# Patient Record
Sex: Female | Born: 1970 | Race: White | Hispanic: No | Marital: Single | Smoking: Never smoker
Health system: Southern US, Community
[De-identification: ages and names within clinical notes are randomized; demographics above are authoritative.]

## PROBLEM LIST (undated history)

## (undated) DIAGNOSIS — M109 Gout, unspecified: Secondary | ICD-10-CM

## (undated) DIAGNOSIS — R7303 Prediabetes: Secondary | ICD-10-CM

---

## 2005-02-07 ENCOUNTER — Emergency Department: Payer: Self-pay | Admitting: Emergency Medicine

## 2005-02-18 ENCOUNTER — Emergency Department: Payer: Self-pay | Admitting: Emergency Medicine

## 2005-02-25 ENCOUNTER — Emergency Department: Payer: Self-pay | Admitting: Emergency Medicine

## 2005-06-13 ENCOUNTER — Emergency Department: Payer: Self-pay | Admitting: Emergency Medicine

## 2006-03-02 ENCOUNTER — Emergency Department: Payer: Self-pay | Admitting: Unknown Physician Specialty

## 2007-02-05 ENCOUNTER — Emergency Department: Payer: Self-pay | Admitting: Emergency Medicine

## 2007-02-09 ENCOUNTER — Emergency Department: Payer: Self-pay | Admitting: Unknown Physician Specialty

## 2009-02-07 ENCOUNTER — Emergency Department: Payer: Self-pay | Admitting: Unknown Physician Specialty

## 2009-03-21 ENCOUNTER — Emergency Department: Payer: Self-pay | Admitting: Emergency Medicine

## 2011-08-28 ENCOUNTER — Emergency Department: Payer: Self-pay | Admitting: Emergency Medicine

## 2012-12-09 ENCOUNTER — Emergency Department: Payer: Self-pay | Admitting: Emergency Medicine

## 2012-12-09 LAB — COMPREHENSIVE METABOLIC PANEL
Albumin: 3.8 g/dL (ref 3.4–5.0)
Alkaline Phosphatase: 55 U/L (ref 50–136)
Anion Gap: 12 (ref 7–16)
Bilirubin,Total: 0.5 mg/dL (ref 0.2–1.0)
Chloride: 108 mmol/L — ABNORMAL HIGH (ref 98–107)
Co2: 21 mmol/L (ref 21–32)
EGFR (African American): >60
Total Protein: 8.4 g/dL — ABNORMAL HIGH (ref 6.4–8.2)

## 2012-12-09 LAB — URINALYSIS, COMPLETE
Protein: 30
Specific Gravity: 1.028 (ref 1.003–1.030)

## 2012-12-09 LAB — CBC
HGB: 14 g/dL (ref 12.0–16.0)
MCH: 29.4 pg (ref 26.0–34.0)
MCHC: 33.6 g/dL (ref 32.0–36.0)
MCV: 88 fL (ref 80–100)
Platelet: 138 10*3/uL — ABNORMAL LOW (ref 150–440)
RBC: 4.76 10*6/uL (ref 3.80–5.20)
RDW: 12.7 % (ref 11.5–14.5)

## 2013-02-27 ENCOUNTER — Emergency Department: Payer: Self-pay | Admitting: Emergency Medicine

## 2013-04-16 ENCOUNTER — Emergency Department: Payer: Self-pay | Admitting: Emergency Medicine

## 2014-03-15 ENCOUNTER — Emergency Department: Payer: Self-pay | Admitting: Emergency Medicine

## 2015-11-15 ENCOUNTER — Emergency Department
Admission: EM | Admit: 2015-11-15 | Discharge: 2015-11-15 | Disposition: A | Discharge status: Home / Self Care | Payer: Self-pay | Attending: Emergency Medicine | Admitting: Emergency Medicine

## 2015-11-15 ENCOUNTER — Encounter: Payer: Self-pay | Admitting: Emergency Medicine

## 2015-11-15 DIAGNOSIS — R6511 Systemic inflammatory response syndrome (SIRS) of non-infectious origin with acute organ dysfunction: Secondary | ICD-10-CM | POA: Insufficient documentation

## 2015-11-15 DIAGNOSIS — L03116 Cellulitis of left lower limb: Secondary | ICD-10-CM | POA: Insufficient documentation

## 2015-11-15 DIAGNOSIS — R600 Localized edema: Secondary | ICD-10-CM | POA: Insufficient documentation

## 2015-11-15 DIAGNOSIS — L03115 Cellulitis of right lower limb: Secondary | ICD-10-CM | POA: Insufficient documentation

## 2015-11-15 DIAGNOSIS — L03119 Cellulitis of unspecified part of limb: Secondary | ICD-10-CM

## 2015-11-15 LAB — COMPREHENSIVE METABOLIC PANEL
ALT: 54 U/L (ref 14–54)
AST: 37 U/L (ref 15–41)
Albumin: 4.2 g/dL (ref 3.5–5.0)
Alkaline Phosphatase: 51 U/L (ref 38–126)
Anion gap: 9 (ref 5–15)
BUN: 12 mg/dL (ref 6–20)
CHLORIDE: 106 mmol/L (ref 101–111)
CO2: 22 mmol/L (ref 22–32)
Calcium: 9 mg/dL (ref 8.9–10.3)
Creatinine, Ser: 0.62 mg/dL (ref 0.44–1.00)
GFR calc Af Amer: >60 mL/min (ref >60)
Glucose, Bld: 94 mg/dL (ref 65–99)
POTASSIUM: 3.4 mmol/L — AB (ref 3.5–5.1)
SODIUM: 137 mmol/L (ref 135–145)
Total Bilirubin: 0.9 mg/dL (ref 0.3–1.2)
Total Protein: 7.8 g/dL (ref 6.5–8.1)

## 2015-11-15 LAB — URIC ACID: URIC ACID, SERUM: 4.2 mg/dL (ref 2.3–6.6)

## 2015-11-15 LAB — SEDIMENTATION RATE: SED RATE: 40 mm/h — AB (ref 0–20)

## 2015-11-15 LAB — CBC
HCT: 36.6 % (ref 35.0–47.0)
HEMOGLOBIN: 12.7 g/dL (ref 12.0–16.0)
MCH: 29 pg (ref 26.0–34.0)
MCHC: 34.7 g/dL (ref 32.0–36.0)
MCV: 83.6 fL (ref 80.0–100.0)
PLATELETS: 202 10*3/uL (ref 150–440)
RBC: 4.38 MIL/uL (ref 3.80–5.20)
RDW: 13.8 % (ref 11.5–14.5)
WBC: 4 10*3/uL (ref 3.6–11.0)

## 2015-11-15 LAB — TROPONIN I: Troponin I: <0.03 ng/mL (ref <0.031)

## 2015-11-15 MED ORDER — METHYLPREDNISOLONE SODIUM SUCC 125 MG IJ SOLR
125.0000 mg | Freq: Once | INTRAMUSCULAR | Status: AC
  Administered 2015-11-15: 125 mg via INTRAVENOUS
  Filled 2015-11-15: qty 2

## 2015-11-15 MED ORDER — MORPHINE SULFATE (PF) 4 MG/ML IV SOLN
4.0000 mg | Freq: Once | INTRAVENOUS | Status: AC
Start: 2015-11-15 — End: 2015-11-15
  Administered 2015-11-15: 4 mg via INTRAVENOUS
  Filled 2015-11-15: qty 1

## 2015-11-15 MED ORDER — PREDNISONE 20 MG PO TABS
40.0000 mg | ORAL_TABLET | Freq: Every day | ORAL | Status: DC
Start: 2015-11-15 — End: 2016-01-13

## 2015-11-15 MED ORDER — CEPHALEXIN 500 MG PO CAPS
500.0000 mg | ORAL_CAPSULE | Freq: Once | ORAL | Status: AC
  Administered 2015-11-15: 500 mg via ORAL
  Filled 2015-11-15: qty 1

## 2015-11-15 MED ORDER — MORPHINE SULFATE (PF) 4 MG/ML IV SOLN
4.0000 mg | Freq: Once | INTRAVENOUS | Status: AC
  Administered 2015-11-15: 4 mg via INTRAVENOUS
  Filled 2015-11-15: qty 1

## 2015-11-15 MED ORDER — OXYCODONE-ACETAMINOPHEN 5-325 MG PO TABS: 1.0000 | ORAL_TABLET | Freq: Four times a day (QID) | ORAL | Status: DC | PRN

## 2015-11-15 MED ORDER — ONDANSETRON HCL 4 MG/2ML IJ SOLN
4.0000 mg | Freq: Once | INTRAMUSCULAR | Status: AC
  Administered 2015-11-15: 4 mg via INTRAVENOUS
  Filled 2015-11-15: qty 2

## 2015-11-15 MED ORDER — CEPHALEXIN 500 MG PO CAPS: 500.0000 mg | ORAL_CAPSULE | Freq: Three times a day (TID) | ORAL | Status: DC

## 2015-11-15 NOTE — ED Notes (Signed)
NAD noted at time of D/C. Pt denies questions or concerns. Pt taken to the lobby via wheelchair at this time.  

## 2015-11-15 NOTE — ED Notes (Signed)
Pt in with co bilat feet swelling since yest, no hx of the same.

## 2015-11-15 NOTE — ED Notes (Signed)
Pt uprite on stretcher in exam room, tearful; pt reports pain & swelling/redness to feet bilat since yesterday; denies any accomp symptoms; denies hx of same; taking tylenol and goody's without relief; +PP, brisk cap refill, W&D, tenderness to touch

## 2015-11-15 NOTE — Discharge Instructions (Signed)
You have been seen in the emergency department today for bilateral foot swelling and pain. Your workup as shown largely normal results. As we discussed please take your medications as prescribed, please follow-up with your primary care physician in the next 1-2 days for recheck/reevaluation. If you do not currently have a primary care physician please call the number provided for [facility] clinic to arrange a follow-up appointment in 2 days for recheck. Return to the emergency department if you have any increased pain, develop a fever, or develop any other symptom personally concerning to yourself.   Peripheral Edema You have swelling in your legs (peripheral edema). This swelling is due to excess accumulation of salt and water in your body. Edema may be a sign of heart, kidney or liver disease, or a side effect of a medication. It may also be due to problems in the leg veins. Elevating your legs and using special support stockings may be very helpful, if the cause of the swelling is due to poor venous circulation. Avoid long periods of standing, whatever the cause. Treatment of edema depends on identifying the cause. Chips, pretzels, pickles and other salty foods should be avoided. Restricting salt in your diet is almost always needed. Water pills (diuretics) are often used to remove the excess salt and water from your body via urine. These medicines prevent the kidney from reabsorbing sodium. This increases urine flow. Diuretic treatment may also result in lowering of potassium levels in your body. Potassium supplements may be needed if you have to use diuretics daily. Daily weights can help you keep track of your progress in clearing your edema. You should call your caregiver for follow up care as recommended. SEEK IMMEDIATE MEDICAL CARE IF:   You have increased swelling, pain, redness, or heat in your legs.  You develop shortness of breath, especially when lying down.  You develop chest or  abdominal pain, weakness, or fainting.  You have a fever.   This information is not intended to replace advice given to you by your health care provider. Make sure you discuss any questions you have with your health care provider.   Document Released: 10/16/2004 Document Revised: 12/01/2011 Document Reviewed: 03/21/2015 Elsevier Interactive Patient Education 2016 [employer]  Cellulitis Cellulitis is an infection of the skin and the tissue under the skin. The infected area is usually red and tender. This happens most often in the arms and lower legs. HOME CARE   Take your antibiotic medicine as told. Finish the medicine even if you start to feel better.  Keep the infected arm or leg raised (elevated).  Put a warm cloth on the area up to 4 times per day.  Only take medicines as told by your doctor.  Keep all doctor visits as told. GET HELP IF:  You see red streaks on the skin coming from the infected area.  Your red area gets bigger or turns a dark color.  Your bone or joint under the infected area is painful after the skin heals.  Your infection comes back in the same area or different area.  You have a puffy (swollen) bump in the infected area.  You have new symptoms.  You have a fever. GET HELP RIGHT AWAY IF:   You feel very sleepy.  You throw up (vomit) or have watery poop (diarrhea).  You feel sick and have muscle aches and pains.   This information is not intended to replace advice given to you by your health care provider. Make  sure you discuss any questions you have with your health care provider.   Document Released: 02/25/2008 Document Revised: 05/30/2015 Document Reviewed: 11/24/2011 Elsevier Interactive Patient Education [employer]

## 2015-11-15 NOTE — ED Provider Notes (Signed)
[facility] Emergency Department Provider Note  Time seen: 6:06 AM  I have reviewed the triage vital signs and the nursing notes.   HISTORY  Chief Complaint Foot Swelling    HPI Leslie Greene is a 45 y.o. female with no past medical history presents the emergency department with bilateral foot swelling and pain since yesterday. According to the patient she first noticed around 6 PM yesterday that she was having pain in her feet with walking, states the pain has progressed and if he did become swollen especially on the left foot. Denies any nausea, vomiting, fever. Denies any history of gout. Denies diabetes.Describes her pain as moderate, worse with ambulation.     No past medical history on file.  There are no active problems to display for this patient.   No past surgical history on file.  No current outpatient prescriptions on file.  Allergies Review of patient's allergies indicates no known allergies.  No family history on file.  Social History Social History  Substance Use Topics  . Smoking status: Not on file  . Smokeless tobacco: Not on file  . Alcohol Use: Not on file    Review of Systems Constitutional: Negative for fever. Cardiovascular: Negative for chest pain. Respiratory: Negative for shortness of breath. Gastrointestinal: Negative for abdominal pain Musculoskeletal: Bilateral foot pain. Skin: Negative for rash. Neurological: Negative for headache 10-point ROS otherwise negative.  ____________________________________________   PHYSICAL EXAM:  VITAL SIGNS: ED Triage Vitals  Enc Vitals Group     BP 11/15/15 0553 129/83 mmHg     Pulse Rate 11/15/15 0553 75     Resp 11/15/15 0553 18     Temp 11/15/15 0553 98.3 F (36.8 C)     Temp Source 11/15/15 0553 Oral     SpO2 11/15/15 0553 100 %     Weight 11/15/15 0553 190 lb (86.183 kg)     Height 11/15/15 0553 5' (1.524 m)     Head Cir --      Peak Flow --      Pain Score  11/15/15 0553 10     Pain Loc --      Pain Edu? --      Excl. in GC? --     Constitutional: Alert and oriented. Well appearing and in no distress. Eyes: Normal exam ENT   Head: Normocephalic and atraumatic.   Mouth/Throat: Mucous membranes are moist. Cardiovascular: Normal rate, regular rhythm. No murmur Respiratory: Normal respiratory effort without tachypnea nor retractions. Breath sounds are clear  Gastrointestinal: Soft and nontender. No distention.  Musculoskeletal: Patient has moderate edema of the left foot, several areas of erythema on the left foot with warmth. Right foot is not appreciably swollen, there is redness and moderate tenderness to palpation over the right first MTP joint. No calf tenderness or popliteal tenderness to palpation. Neurologic:  Normal speech and language. No gross focal neurologic deficits Skin:  Skin is warm, dry  ____________________________________________    INITIAL IMPRESSION / ASSESSMENT AND PLAN / ED COURSE  Pertinent labs & imaging results that were available during my care of the patient were reviewed by me and considered in my medical decision making (see chart for details).  Patient with bilateral foot swelling and tenderness. Patient does have moderate tenderness over the bilateral MTP joints, denies any history of gout although her exam is suggestive of gout. Patient sensation is intact bilaterally, 2+ DP pulse bilaterally. No calf tenderness to palpation.  Patient's labs are largely within normal  limits besides an elevated ESR of 40. It is unclear the exact etiology of her foot pain and swelling, likely an inflammatory response of some kind. No clinical concern for DVT. Given her elevated ESR we will start the patient on a course of prednisone, pain medication and will also cover with Keflex for a possible cellulitis. Patient is to follow-up with her primary care physician in 2 days for recheck/reevaluation. I discussed this plan of  care with the patient who is agreeable.  ____________________________________________   FINAL CLINICAL IMPRESSION(S) / ED DIAGNOSES  Inflammatory response Cellulitis   Harvest Dark, MD 11/15/15 [id number]

## 2016-01-13 ENCOUNTER — Encounter: Payer: Self-pay | Admitting: Emergency Medicine

## 2016-01-13 ENCOUNTER — Emergency Department
Admission: EM | Admit: 2016-01-13 | Discharge: 2016-01-13 | Disposition: A | Discharge status: Home / Self Care | Payer: Self-pay | Attending: Emergency Medicine | Admitting: Emergency Medicine

## 2016-01-13 DIAGNOSIS — M1000 Idiopathic gout, unspecified site: Secondary | ICD-10-CM | POA: Insufficient documentation

## 2016-01-13 DIAGNOSIS — M79671 Pain in right foot: Secondary | ICD-10-CM | POA: Insufficient documentation

## 2016-01-13 LAB — CBC
HCT: 41 % (ref 35.0–47.0)
Hemoglobin: 14.2 g/dL (ref 12.0–16.0)
MCH: 29.3 pg (ref 26.0–34.0)
MCHC: 34.6 g/dL (ref 32.0–36.0)
MCV: 84.7 fL (ref 80.0–100.0)
Platelets: 238 10*3/uL (ref 150–440)
RBC: 4.84 MIL/uL (ref 3.80–5.20)
RDW: 13 % (ref 11.5–14.5)
WBC: 6.6 10*3/uL (ref 3.6–11.0)

## 2016-01-13 MED ORDER — PREDNISONE 10 MG PO TABS: ORAL_TABLET | ORAL | Status: DC

## 2016-01-13 MED ORDER — OXYCODONE-ACETAMINOPHEN 5-325 MG PO TABS
1.0000 | ORAL_TABLET | ORAL | Status: AC | PRN
  Administered 2016-01-13 (×2): 1 via ORAL
  Filled 2016-01-13: qty 1

## 2016-01-13 MED ORDER — OXYCODONE-ACETAMINOPHEN 5-325 MG PO TABS
ORAL_TABLET | ORAL | Status: AC
  Administered 2016-01-13: 1 via ORAL
  Filled 2016-01-13: qty 1

## 2016-01-13 MED ORDER — COLCHICINE 0.6 MG PO TABS: 0.6000 mg | ORAL_TABLET | Freq: Every day | ORAL | Status: AC

## 2016-01-13 MED ORDER — COLCHICINE 0.6 MG PO TABS
1.2000 mg | ORAL_TABLET | Freq: Once | ORAL | Status: AC
  Administered 2016-01-13: 1.2 mg via ORAL
  Filled 2016-01-13: qty 2

## 2016-01-13 MED ORDER — PREDNISONE 20 MG PO TABS
40.0000 mg | ORAL_TABLET | Freq: Once | ORAL | Status: AC
  Administered 2016-01-13: 40 mg via ORAL
  Filled 2016-01-13: qty 2

## 2016-01-13 NOTE — Discharge Instructions (Signed)
You were evaluated for foot pain and swelling, and her symptoms are consistent with gout.  At this point I do not think the redness is an active infection called cellulitis.  You are being prescribed medication called colchicine, one dose daily until 3 days with no symptoms.  You're being prescribed prednisone, which is an anti-inflammation and to help with gout.  Return to the emergency room for any worsening pain, redness and rash, fever, any calf pain or redness up into the calf. Return for any nausea or vomiting.  You need to follow up with a primary care physician, and are [facility].   Gout Gout is an inflammatory arthritis caused by a buildup of uric acid crystals in the joints. Uric acid is a chemical that is normally present in the blood. When the level of uric acid in the blood is too high it can form crystals that deposit in your joints and tissues. This causes joint redness, soreness, and swelling (inflammation). Repeat attacks are common. Over time, uric acid crystals can form into masses (tophi) near a joint, destroying bone and causing disfigurement. Gout is treatable and often preventable. CAUSES  The disease begins with elevated levels of uric acid in the blood. Uric acid is produced by your body when it breaks down a naturally found substance called purines. Certain foods you eat, such as meats and fish, contain high amounts of purines. Causes of an elevated uric acid level include:  Being passed down from parent to child (heredity).  Diseases that cause increased uric acid production (such as obesity, psoriasis, and certain cancers).  Excessive alcohol use.  Diet, especially diets rich in meat and seafood.  Medicines, including certain cancer-fighting medicines (chemotherapy), water pills (diuretics), and aspirin.  Chronic kidney disease. The kidneys are no longer able to remove uric acid well.  Problems with metabolism. Conditions strongly  associated with gout include:  Obesity.  High blood pressure.  High cholesterol.  Diabetes. Not everyone with elevated uric acid levels gets gout. It is not understood why some people get gout and others do not. Surgery, joint injury, and eating too much of certain foods are some of the factors that can lead to gout attacks. SYMPTOMS   An attack of gout comes on quickly. It causes intense pain with redness, swelling, and warmth in a joint.  Fever can occur.  Often, only one joint is involved. Certain joints are more commonly involved:  Base of the big toe.  Knee.  Ankle.  Wrist.  Finger. Without treatment, an attack usually goes away in a few days to weeks. Between attacks, you usually will not have symptoms, which is different from many other forms of arthritis. DIAGNOSIS  Your caregiver will suspect gout based on your symptoms and exam. In some cases, tests may be recommended. The tests may include:  Blood tests.  Urine tests.  X-rays.  Joint fluid exam. This exam requires a needle to remove fluid from the joint (arthrocentesis). Using a microscope, gout is confirmed when uric acid crystals are seen in the joint fluid. TREATMENT  There are two phases to gout treatment: treating the sudden onset (acute) attack and preventing attacks (prophylaxis).  Treatment of an Acute Attack.  Medicines are used. These include anti-inflammatory medicines or steroid medicines.  An injection of steroid medicine into the affected joint is sometimes necessary.  The painful joint is rested. Movement can worsen the arthritis.  You may use warm or cold treatments on painful joints,  depending which works best for you.  Treatment to Prevent Attacks.  If you suffer from frequent gout attacks, your caregiver may advise preventive medicine. These medicines are started after the acute attack subsides. These medicines either help your kidneys eliminate uric acid from your body or decrease  your uric acid production. You may need to stay on these medicines for a very long time.  The early phase of treatment with preventive medicine can be associated with an increase in acute gout attacks. For this reason, during the first few months of treatment, your caregiver may also advise you to take medicines usually used for acute gout treatment. Be sure you understand your caregiver's directions. Your caregiver may make several adjustments to your medicine dose before these medicines are effective.  Discuss dietary treatment with your caregiver or dietitian. Alcohol and drinks high in sugar and fructose and foods such as meat, poultry, and seafood can increase uric acid levels. Your caregiver or dietitian can advise you on drinks and foods that should be limited. HOME CARE INSTRUCTIONS   Do not take aspirin to relieve pain. This raises uric acid levels.  Only take over-the-counter or prescription medicines for pain, discomfort, or fever as directed by your caregiver.  Rest the joint as much as possible. When in bed, keep sheets and blankets off painful areas.  Keep the affected joint raised (elevated).  Apply warm or cold treatments to painful joints. Use of warm or cold treatments depends on which works best for you.  Use crutches if the painful joint is in your leg.  Drink enough fluids to keep your urine clear or pale yellow. This helps your body get rid of uric acid. Limit alcohol, sugary drinks, and fructose drinks.  Follow your dietary instructions. Pay careful attention to the amount of protein you eat. Your daily diet should emphasize fruits, vegetables, whole grains, and fat-free or low-fat milk products. Discuss the use of coffee, vitamin C, and cherries with your caregiver or dietitian. These may be helpful in lowering uric acid levels.  Maintain a healthy body weight. SEEK MEDICAL CARE IF:   You develop diarrhea, vomiting, or any side effects from medicines.  You do not  feel better in 24 hours, or you are getting worse. SEEK IMMEDIATE MEDICAL CARE IF:   Your joint becomes suddenly more tender, and you have chills or a fever. MAKE SURE YOU:   Understand these instructions.  Will watch your condition.  Will get help right away if you are not doing well or get worse.   This information is not intended to replace advice given to you by your health care provider. Make sure you discuss any questions you have with your health care provider.   Document Released: 09/05/2000 Document Revised: 09/29/2014 Document Reviewed: 04/21/2012 Elsevier Interactive Patient Education [employer]

## 2016-01-13 NOTE — ED Provider Notes (Signed)
[facility] Emergency Department Provider Note   ____________________________________________  Time seen: Approximately 12 PM I have reviewed the triage vital signs and the triage nursing note.  HISTORY  Chief Complaint Foot Pain and Cellulitis   Historian Patient  HPI Leslie Greene is a 45 y.o. female who is here with bilateral foot pain. Pain started yesterday. She states that she's had one episode like this several months ago. At that point she was given prednisone and antibiotic for possible cellulitis. When I reviewed her chart it looks like she was diagnosed with likely gout with possible cellulitis.  Patient did not follow-up with primary care physician as she does not have one.  In terms of this episode, no systemic illness, no fevers, no nausea, no swelling past the mid foot.  Nothing makes it worse or better. Pain is severe.    History reviewed. No pertinent past medical history.  There are no active problems to display for this patient.   History reviewed. No pertinent past surgical history.  Current Outpatient Rx  Name  Route  Sig  Dispense  Refill  . cephALEXin (KEFLEX) 500 MG capsule   Oral   Take 1 capsule (500 mg total) by mouth 3 (three) times daily.   30 capsule   0   . oxyCODONE-acetaminophen (ROXICET) 5-325 MG tablet   Oral   Take 1 tablet by mouth every 6 (six) hours as needed.   15 tablet   0   . predniSONE (DELTASONE) 20 MG tablet   Oral   Take 2 tablets (40 mg total) by mouth daily.   10 tablet   0     Allergies Review of patient's allergies indicates no known allergies.  History reviewed. No pertinent family history.  Social History Social History  Substance Use Topics  . Smoking status: Never Smoker   . Smokeless tobacco: None  . Alcohol Use: No    Review of Systems  Constitutional: Negative for fever. Eyes: Negative for visual changes. ENT: Negative for sore throat. Cardiovascular: Negative for  chest pain. Respiratory: Negative for shortness of breath. Gastrointestinal: Negative for abdominal pain, vomiting and diarrhea. Genitourinary: Negative for dysuria. Musculoskeletal: Negative for back pain. Skin: Some swelling and redness over the MTP bilaterally. Neurological: Negative for headache. 10 point Review of Systems otherwise negative ____________________________________________   PHYSICAL EXAM:  VITAL SIGNS: ED Triage Vitals  Enc Vitals Group     BP 01/13/16 0856 132/104 mmHg     Pulse Rate 01/13/16 0853 91     Resp 01/13/16 0853 18     Temp 01/13/16 0853 98.1 F (36.7 C)     Temp Source 01/13/16 0853 Oral     SpO2 01/13/16 0853 95 %     Weight 01/13/16 0853 190 lb (86.183 kg)     Height 01/13/16 0853 5' (1.524 m)     Head Cir --      Peak Flow --      Pain Score 01/13/16 0856 10     Pain Loc --      Pain Edu? --      Excl. in GC? --      Constitutional: Alert and oriented. Well appearing Overall but complaining of pain in her feet.Marland Kitchen HEENT   Head: Normocephalic and atraumatic.      Eyes: Conjunctivae are normal. PERRL. Normal extraocular movements.      Ears:         Nose: No congestion/rhinnorhea.   Mouth/Throat: Mucous membranes are moist.  Neck: No stridor. Cardiovascular/Chest: Normal rate, regular rhythm.  No murmurs, rubs, or gallops. Respiratory: Normal respiratory effort without tachypnea nor retractions. Breath sounds are clear and equal bilaterally. No wheezes/rales/rhonchi. Gastrointestinal: Soft. No distention, no guarding, no rebound. Nontender.    Genitourinary/rectal:Deferred Musculoskeletal: Mild edema of bilateral feet. Erythema and tenderness over his bilateral MTP joints, especially the right. She does have some redness along the sole.  No calf tenderness. She has some scabbing of the left posterior calf. Neurologic:  Normal speech and language. No gross or focal neurologic deficits are appreciated. Skin:  Skin is warm, dry and  intact. No rash noted. Psychiatric: Mood and affect are normal. Speech and behavior are normal. Patient exhibits appropriate insight and judgment.  ____________________________________________   EKG I, Governor Rooksebecca Navin Dogan, MD, the attending physician have personally viewed and interpreted all ECGs.  None ____________________________________________  LABS (pertinent positives/negatives)  White blood count 6.6, hemoglobin 14.2 and platelet count 238  ____________________________________________  RADIOLOGY All Xrays were viewed by me. Imaging interpreted by Radiologist.  None __________________________________________  PROCEDURES  Procedure(s) performed: None  Critical Care performed: None  ____________________________________________   ED COURSE / ASSESSMENT AND PLAN  Pertinent labs & imaging results that were available during my care of the patient were reviewed by me and considered in my medical decision making (see chart for details).   Clinically her exam is consistent with gout. When I reviewed her prior note from February, Dr. Lenard LancePaduchowski also clinically felt the patient was likely experiencing gout, and treated her with prednisone and also course of Keflex for the possibility of additional cellulitis. Patient herself states that the swelling and redness is a lot less this time that it was last time.  She's not giving any infectious symptoms including no fevers, or systemic illness. Her white blood cell count is normal.  The redness and pain is focused over the first MTP bilaterally but extends along the line.  No evidence of DVT clinically.  She has been trying Tylenol and Aleve at home.  I will try a dose of colchicine here as well as a course of prednisone by mouth. I referred her for clinical clinic primary care follow-up.  We discussed return precautions especially with respect to infectious/cellulitis symptoms.   CONSULTATIONS:   None   Patient / Family /  Caregiver informed of clinical course, medical decision-making process, and agree with plan.   ___________________________________________   FINAL CLINICAL IMPRESSION(S) / ED DIAGNOSES   Final diagnoses:  Idiopathic gout, unspecified chronicity, unspecified site              Note: This dictation was prepared with Dragon dictation. Any transcriptional errors that result from this process are unintentional   Governor Rooksebecca Dorcas Melito, MD 01/13/16 1230

## 2016-01-13 NOTE — ED Notes (Signed)
Patient with mild redness and swelling to both feet. Redness worse on inner aspects of feet around great toe joints.

## 2016-01-13 NOTE — ED Notes (Signed)
Pt given water 

## 2016-01-13 NOTE — ED Notes (Signed)
MD at bedside. 

## 2016-01-13 NOTE — ED Notes (Signed)
Patient noticed yesterday evening redness to both feet and pain. Unable to stand without pain.  Patient states she has had cellulitis to her feet in the past and feels like the same pain, but worse.  Feet are swollen.

## 2016-01-13 NOTE — ED Notes (Signed)
Pt family out to desk asking for pt to have something for pain. Family informed by 2 different RN that MD will have to seen patient and prescribe something for pain. MD informed, no orders at this time. PT has received 2 percocet per protocol prior to this time.

## 2017-02-22 ENCOUNTER — Emergency Department: Payer: Self-pay

## 2017-02-22 ENCOUNTER — Encounter: Payer: Self-pay | Admitting: Emergency Medicine

## 2017-02-22 ENCOUNTER — Emergency Department
Admission: EM | Admit: 2017-02-22 | Discharge: 2017-02-22 | Disposition: A | Discharge status: Home / Self Care | Payer: Self-pay | Attending: Emergency Medicine | Admitting: Emergency Medicine

## 2017-02-22 DIAGNOSIS — M25571 Pain in right ankle and joints of right foot: Secondary | ICD-10-CM | POA: Insufficient documentation

## 2017-02-22 DIAGNOSIS — M25561 Pain in right knee: Secondary | ICD-10-CM | POA: Insufficient documentation

## 2017-02-22 HISTORY — DX: Gout, unspecified: M10.9

## 2017-02-22 HISTORY — DX: Prediabetes: R73.03

## 2017-02-22 MED ORDER — PREDNISONE 10 MG (21) PO TBPK: ORAL_TABLET | ORAL | 0 refills | Status: DC

## 2017-02-22 MED ORDER — LORAZEPAM 0.5 MG PO TABS
0.5000 mg | ORAL_TABLET | Freq: Once | ORAL | Status: AC
  Administered 2017-02-22: 0.5 mg via ORAL
  Filled 2017-02-22: qty 1

## 2017-02-22 MED ORDER — PREDNISONE 20 MG PO TABS
60.0000 mg | ORAL_TABLET | Freq: Once | ORAL | Status: AC
  Administered 2017-02-22: 60 mg via ORAL
  Filled 2017-02-22: qty 3

## 2017-02-22 NOTE — ED Triage Notes (Signed)
Patient Arrived [facility] with complaint of right knee and right ankle pain. Patient states she woke with this pain, no recent falls or trauma. Hx of gout, denies similar pain to previous gout. Described as numb pain.
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
## 2017-06-09 ENCOUNTER — Emergency Department
Admission: EM | Admit: 2017-06-09 | Discharge: 2017-06-09 | Disposition: A | Discharge status: Home / Self Care | Payer: Self-pay | Attending: Emergency Medicine | Admitting: Emergency Medicine

## 2017-06-09 ENCOUNTER — Encounter: Payer: Self-pay | Admitting: Emergency Medicine

## 2017-06-09 DIAGNOSIS — J069 Acute upper respiratory infection, unspecified: Secondary | ICD-10-CM | POA: Insufficient documentation

## 2017-06-09 MED ORDER — FLUTICASONE PROPIONATE 50 MCG/ACT NA SUSP: 2.0000 | Freq: Every day | NASAL | 0 refills | Status: AC

## 2017-06-09 MED ORDER — GUAIFENESIN-CODEINE 100-10 MG/5ML PO SYRP: 5.0000 mL | ORAL_SOLUTION | Freq: Three times a day (TID) | ORAL | 0 refills | Status: AC | PRN

## 2017-06-09 NOTE — ED Notes (Signed)
See triage note  States she worked a long shift this past weekend and developed some sinus pressure and cough   Did fever some fever on Sunday and Monday  But afebrile at present

## 2017-06-09 NOTE — ED Triage Notes (Signed)
Cold sx. Fever yesterday and day before.  Says she needs to know if she is contagious .

## 2017-06-09 NOTE — ED Provider Notes (Signed)
[facility] Emergency Department Provider Note  ____________________________________________  Time seen: Approximately 12:32 PM  I have reviewed the triage vital signs and the nursing notes.   HISTORY  Chief Complaint Cough and URI    HPI Bretta Fees is a 46 y.o. female that presents to the emergency department with 2 days of nasal congestion and non productive cough.Patient works at [employer] and needs to now if she is contagious and if she needs to stay home from work. She took NyQuil with some relief. She does not smoke. She denies fever, sore throat, shortness of breath, chest pain, nausea, vomiting, abdominal pain.   Past Medical History:  Diagnosis Date  . Borderline diabetic   . Gout     There are no active problems to display for this patient.   History reviewed. No pertinent surgical history.  Prior to Admission medications   Medication Sig Start Date End Date Taking? Authorizing Provider  cephALEXin (KEFLEX) 500 MG capsule Take 1 capsule (500 mg total) by mouth 3 (three) times daily. 11/15/15   Minna Antis, MD  colchicine 0.6 MG tablet Take 1 tablet (0.6 mg total) by mouth daily. 01/13/16 01/12/17  Governor Rooks, MD  fluticasone (FLONASE) 50 MCG/ACT nasal spray Place 2 sprays into both nostrils daily. 06/09/17 06/09/18  Enid Derry, PA-C  guaiFENesin-codeine (ROBITUSSIN AC) 100-10 MG/5ML syrup Take 5 mLs by mouth 3 (three) times daily as needed for cough. 06/09/17 06/11/17  Enid Derry, PA-C  oxyCODONE-acetaminophen (ROXICET) 5-325 MG tablet Take 1 tablet by mouth every 6 (six) hours as needed. 11/15/15   Minna Antis, MD  predniSONE (STERAPRED UNI-PAK 21 TAB) 10 MG (21) TBPK tablet Take 6 tablets on day 1. Take 5 tablets on day 2. Take 4 tablets on day 3. Take 3 tablets on day 4. Take 2 tablets on day 5. Take 1 tablets on day 6. 02/22/17   Little, Traci M, PA-C    Allergies Patient has no known allergies.  No family history on  file.  Social History Social History  Substance Use Topics  . Smoking status: Never Smoker  . Smokeless tobacco: Never Used  . Alcohol use No     Review of Systems  Constitutional: No fever/chills Eyes: No visual changes. No discharge. ENT: Positive for congestion and rhinorrhea. Cardiovascular: No chest pain. Respiratory: Positive for cough. No SOB. Gastrointestinal: No abdominal pain.  No nausea, no vomiting.  No diarrhea.  No constipation. Musculoskeletal: Negative for musculoskeletal pain. Skin: Negative for rash, abrasions, lacerations, ecchymosis. Neurological: Negative for headaches.   ____________________________________________   PHYSICAL EXAM:  VITAL SIGNS: ED Triage Vitals [06/09/17 1208]  Enc Vitals Group     BP 119/70     Pulse Rate 79     Resp 16     Temp 98.2 F (36.8 C)     Temp Source Oral     SpO2 97 %     Weight 210 lb (95.3 kg)     Height 5' (1.524 m)     Head Circumference      Peak Flow      Pain Score 0     Pain Loc      Pain Edu?      Excl. in GC?      Constitutional: Alert and oriented. Well appearing and in no acute distress. Eyes: Conjunctivae are normal. PERRL. EOMI. No discharge. Head: Atraumatic. ENT: No frontal and maxillary sinus tenderness.      Ears: Tympanic membranes pearly gray with  good landmarks. No discharge.      Nose: Mild congestion/rhinnorhea.      Mouth/Throat: Mucous membranes are moist. Oropharynx non-erythematous. Tonsils not enlarged. No exudates. Uvula midline. Neck: No stridor.   Hematological/Lymphatic/Immunilogical: No cervical lymphadenopathy. Cardiovascular: Normal rate, regular rhythm.  Good peripheral circulation. Respiratory: Normal respiratory effort without tachypnea or retractions. Lungs CTAB. Good air entry to the bases with no decreased or absent breath sounds. Gastrointestinal: Bowel sounds 4 quadrants. Soft and nontender to palpation. No guarding or rigidity. No palpable masses. No  distention. Musculoskeletal: Full range of motion to all extremities. No gross deformities appreciated. Neurologic:  Normal speech and language. No gross focal neurologic deficits are appreciated.  Skin:  Skin is warm, dry and intact. No rash noted.   ____________________________________________   LABS (all labs ordered are listed, but only abnormal results are displayed)  Labs Reviewed - No data to display ____________________________________________  EKG   ____________________________________________  RADIOLOGY   No results found.  ____________________________________________    PROCEDURES  Procedure(s) performed:    Procedures    Medications - No data to display   ____________________________________________   INITIAL IMPRESSION / ASSESSMENT AND PLAN / ED COURSE  Pertinent labs & imaging results that were available during my care of the patient were reviewed by me and considered in my medical decision making (see chart for details).  Review of the [place] CSRS was performed in accordance of the [facility] prior to dispensing any controlled drugs.   Patient's diagnosis is consistent with upper respiratory infection. Vital signs and exam are reassuring. Patient feels comfortable going home. Work note was provided. Patient will be discharged home with prescriptions for Robitussin and Flonase. Patient is to follow up with PCP as needed or otherwise directed. Patient is given ED precautions to return to the ED for any worsening or new symptoms.     ____________________________________________  FINAL CLINICAL IMPRESSION(S) / ED DIAGNOSES  Final diagnoses:  Viral upper respiratory tract infection      NEW MEDICATIONS STARTED DURING THIS VISIT:  Discharge Medication List as of 06/09/2017  1:05 PM    START taking these medications   Details  fluticasone (FLONASE) 50 MCG/ACT nasal spray Place 2 sprays into both nostrils daily., Starting Tue 06/09/2017, Until Wed  06/09/2018, Print    guaiFENesin-codeine (ROBITUSSIN AC) 100-10 MG/5ML syrup Take 5 mLs by mouth 3 (three) times daily as needed for cough., Starting Tue 06/09/2017, Until Thu 06/11/2017, Print            This chart was dictated using voice recognition software/Dragon. Despite best efforts to proofread, errors can occur which can change the meaning. Any change was purely unintentional.    Enid Derry, PA-C 06/09/17 1420    Alphonzo Lemmings Rudy Jew, MD 06/09/17 [phone]

## 2017-07-25 ENCOUNTER — Emergency Department
Admission: EM | Admit: 2017-07-25 | Discharge: 2017-07-25 | Disposition: A | Discharge status: Home / Self Care | Payer: Self-pay | Attending: Emergency Medicine | Admitting: Emergency Medicine

## 2017-07-25 ENCOUNTER — Emergency Department: Payer: Self-pay

## 2017-07-25 ENCOUNTER — Encounter: Payer: Self-pay | Admitting: Emergency Medicine

## 2017-07-25 DIAGNOSIS — M79672 Pain in left foot: Secondary | ICD-10-CM

## 2017-07-25 DIAGNOSIS — M109 Gout, unspecified: Secondary | ICD-10-CM | POA: Insufficient documentation

## 2017-07-25 DIAGNOSIS — M79671 Pain in right foot: Secondary | ICD-10-CM | POA: Insufficient documentation

## 2017-07-25 DIAGNOSIS — Z79899 Other long term (current) drug therapy: Secondary | ICD-10-CM | POA: Insufficient documentation

## 2017-07-25 MED ORDER — IBUPROFEN 600 MG PO TABS
600.0000 mg | ORAL_TABLET | Freq: Once | ORAL | Status: AC
  Administered 2017-07-25: 600 mg via ORAL
  Filled 2017-07-25: qty 1

## 2017-07-25 MED ORDER — HYDROCODONE-ACETAMINOPHEN 5-325 MG PO TABS: 1.0000 | ORAL_TABLET | Freq: Four times a day (QID) | ORAL | 0 refills | Status: DC | PRN

## 2017-07-25 MED ORDER — HYDROCODONE-ACETAMINOPHEN 5-325 MG PO TABS
1.0000 | ORAL_TABLET | Freq: Once | ORAL | Status: AC
  Administered 2017-07-25: 1 via ORAL
  Filled 2017-07-25: qty 1

## 2017-07-25 MED ORDER — IBUPROFEN 600 MG PO TABS: 600.0000 mg | ORAL_TABLET | Freq: Three times a day (TID) | ORAL | 0 refills | Status: DC | PRN

## 2017-07-25 NOTE — ED Provider Notes (Signed)
[facility] Emergency Department Provider Note  ____________________________________________   First MD Initiated Contact with Patient 07/25/17 0518     (approximate)  I have reviewed the triage vital signs and the nursing notes.   HISTORY  Chief Complaint Gout    HPI Leslie Greene is a 46 y.o. female who self presents to the emergency department with several days of moderate to severe aching in bilateral feet.  She has a self-reported history of gout although takes no medications.  Her brother has gout as well in this morning she took 2 tablets of colchicine and then 1 tablet several hours thereafter which seemed to help her symptoms.  Pain is worse with walking and improved with rest.  No fevers or chills.  No numbness or weakness.  Past Medical History:  Diagnosis Date  . Borderline diabetic   . Gout     There are no active problems to display for this patient.   History reviewed. No pertinent surgical history.  Prior to Admission medications   Medication Sig Start Date End Date Taking? Authorizing Provider  cephALEXin (KEFLEX) 500 MG capsule Take 1 capsule (500 mg total) by mouth 3 (three) times daily. 11/15/15   Minna Antis, MD  colchicine 0.6 MG tablet Take 1 tablet (0.6 mg total) by mouth daily. 01/13/16 01/12/17  Governor Rooks, MD  fluticasone (FLONASE) 50 MCG/ACT nasal spray Place 2 sprays into both nostrils daily. 06/09/17 06/09/18  Enid Derry, PA-C  HYDROcodone-acetaminophen (NORCO) 5-325 MG tablet Take 1 tablet by mouth every 6 (six) hours as needed for severe pain. 07/25/17   Merrily Brittle, MD  ibuprofen (ADVIL,MOTRIN) 600 MG tablet Take 1 tablet (600 mg total) by mouth every 8 (eight) hours as needed. 07/25/17   Merrily Brittle, MD  oxyCODONE-acetaminophen (ROXICET) 5-325 MG tablet Take 1 tablet by mouth every 6 (six) hours as needed. 11/15/15   Minna Antis, MD  predniSONE (STERAPRED UNI-PAK 21 TAB) 10 MG (21) TBPK tablet Take 6  tablets on day 1. Take 5 tablets on day 2. Take 4 tablets on day 3. Take 3 tablets on day 4. Take 2 tablets on day 5. Take 1 tablets on day 6. 02/22/17   Little, Traci M, PA-C    Allergies Patient has no known allergies.  No family history on file.  Social History Social History  Substance Use Topics  . Smoking status: Never Smoker  . Smokeless tobacco: Never Used  . Alcohol use No    Review of Systems Constitutional: No fever/chills ENT: No sore throat. Cardiovascular: Denies chest pain. Respiratory: Denies shortness of breath. Gastrointestinal: No abdominal pain.  No nausea, no vomiting.  No diarrhea.  No constipation. Musculoskeletal: Negative for back pain. Neurological: Negative for headaches   ____________________________________________   PHYSICAL EXAM:  VITAL SIGNS: ED Triage Vitals  Enc Vitals Group     BP 07/25/17 0105 111/87     Pulse Rate 07/25/17 0105 92     Resp 07/25/17 0105 (!) 22     Temp 07/25/17 0105 98.2 F (36.8 C)     Temp Source 07/25/17 0105 Oral     SpO2 07/25/17 0105 99 %     Weight 07/25/17 0054 200 lb (90.7 kg)     Height 07/25/17 0054 5\' 2"  (1.575 m)     Head Circumference --      Peak Flow --      Pain Score 07/25/17 0054 10     Pain Loc --  Pain Edu? --      Excl. in GC? --     Constitutional: Alert and oriented x4 Head: Atraumatic. Nose: No congestion/rhinnorhea. Mouth/Throat: No trismus Neck: No stridor.   Cardiovascular: Heart rate and rhythm Respiratory: Normal respiratory effort.  No retractions. MSK:  No tenderness over medial malleolus or lateral malleolus or for 6 cm proximal No tenderness over navicular, midfoot, or fifth metatarsal 2+ dorsalis pedis pulse Skin closed Compartments soft Patient can fire extensor hallucis longus, extensor digitorum longus, flexor hallucis longus, flexor digitorum longus, tibialis anterior, and gastrocnemius Sensation intact to light touch to sural, saphenous, deep peroneal,  superficial peroneal, and tibial nerve  Neurologic:  Normal speech and language. No gross focal neurologic deficits are appreciated.  Skin:  Skin is warm, dry and intact. No rash noted.    ____________________________________________  LABS (all labs ordered are listed, but only abnormal results are displayed)  Labs Reviewed - No data to display   __________________________________________  EKG   ____________________________________________  RADIOLOGY  X-rays reviewed by me showed no acute ____________________________________________   DIFFERENTIAL includes but not limited to  Gout, septic joint, arthritis, plantar fasciitis   PROCEDURES  Procedure(s) performed: no  Procedures  Critical Care performed: no  Observation: no ____________________________________________   INITIAL IMPRESSION / ASSESSMENT AND PLAN / ED COURSE  Pertinent labs & imaging results that were available during my care of the patient were reviewed by me and considered in my medical decision making (see chart for details).  Patient is hemodynamically stable and neurovascularly intact and well-appearing.  I had a lengthy discussion with the patient regarding the need to establish care with a primary care physician for outpatient management and prevention of gouty attacks in the future.  She feels better after ibuprofen.  She is already taken 4 doses of colchicine so I will not be able to prescribe her anymore.  She is discharged home in improved condition verbalizes understanding and agreement the plan.      ____________________________________________   FINAL CLINICAL IMPRESSION(S) / ED DIAGNOSES  Final diagnoses:  Acute gout of foot, unspecified cause, unspecified laterality  Foot pain, bilateral      NEW MEDICATIONS STARTED DURING THIS VISIT:  Discharge Medication List as of 07/25/2017  5:25 AM    START taking these medications   Details  HYDROcodone-acetaminophen (NORCO)  5-325 MG tablet Take 1 tablet by mouth every 6 (six) hours as needed for severe pain., Starting Sat 07/25/2017, Print    ibuprofen (ADVIL,MOTRIN) 600 MG tablet Take 1 tablet (600 mg total) by mouth every 8 (eight) hours as needed., Starting Sat 07/25/2017, Print         Note:  This document was prepared using Dragon voice recognition software and may include unintentional dictation errors.      Merrily Brittleifenbark, Percival Glasheen, MD 07/25/17 [phone]

## 2017-07-25 NOTE — ED Triage Notes (Signed)
Pt comes into the ED via [facility] from home c/o gout flare up since this morning on bilateral feet. Patient tearful in triage at this time.  States she has no medication at home and her last flare was 6 months ago.  Patient in NAD at this time and no noticeable swelling to the bilateral feet.

## 2017-07-25 NOTE — Discharge Instructions (Addendum)
Please take ibuprofen 3 times a day for the next week and use your hydrocodone only as needed for severe symptoms.  It is critically important that you establish care with a primary care physician this coming week for reevaluation.  Return to the emergency department for any concerns whatsoever.  It was a pleasure to take care of you today, and thank you for coming to our emergency department.  If you have any questions or concerns before leaving please ask the nurse to grab me and I'm more than happy to go through your aftercare instructions again.  If you were prescribed any opioid pain medication today such as Norco, Vicodin, Percocet, morphine, hydrocodone, or oxycodone please make sure you do not drive when you are taking this medication as it can alter your ability to drive safely.  If you have any concerns once you are home that you are not improving or are in fact getting worse before you can make it to your follow-up appointment, please do not hesitate to call 911 and come back for further evaluation.  Merrily BrittleNeil Jceon Alverio, MD  Results for orders placed or performed during the hospital encounter of 01/13/16  CBC  Result Value Ref Range   WBC 6.6 3.6 - 11.0 K/uL   RBC 4.84 3.80 - 5.20 MIL/uL   Hemoglobin 14.2 12.0 - 16.0 g/dL   HCT 16.141.0 09.635.0 - 04.547.0 %   MCV 84.7 80.0 - 100.0 fL   MCH 29.3 26.0 - 34.0 pg   MCHC 34.6 32.0 - 36.0 g/dL   RDW 40.913.0 81.111.5 - 91.414.5 %   Platelets 238 150 - 440 K/uL   Dg Foot Complete Left  Result Date: 07/25/2017 CLINICAL DATA:  Gout flare of both feet. No additional history provided or available. EXAM: LEFT FOOT - COMPLETE 3+ VIEW COMPARISON:  None. FINDINGS: There is no evidence of fracture or dislocation. The alignment is maintained. Plantar calcaneal spur. No erosions or periosteal reaction. Soft tissues are unremarkable. IMPRESSION: Plantar calcaneal spur. Otherwise unremarkable radiographs of the left foot. Electronically Signed   By: Rubye OaksMelanie  Ehinger M.D.   On:  07/25/2017 01:41   Dg Foot Complete Right  Result Date: 07/25/2017 CLINICAL DATA:  Gout flare of both feet. No additional history provided or available. EXAM: RIGHT FOOT COMPLETE - 3+ VIEW COMPARISON:  Ankle radiograph 02/22/2017 FINDINGS: There is no evidence of fracture or dislocation. Plantar calcaneal spur. No erosions, bony destructive change or periosteal reaction. Soft tissues are unremarkable. IMPRESSION: Plantar calcaneal spur. Otherwise unremarkable radiographs of the right foot. Electronically Signed   By: Rubye OaksMelanie  Ehinger M.D.   On: 07/25/2017 01:43

## 2017-07-25 NOTE — ED Notes (Signed)
Reviewed d/c instructions, follow-up care, prescriptions with patient. Patient verbalized understanding.  

## 2018-06-02 ENCOUNTER — Encounter: Payer: Self-pay | Admitting: Emergency Medicine

## 2018-06-02 ENCOUNTER — Emergency Department
Admission: EM | Admit: 2018-06-02 | Discharge: 2018-06-02 | Disposition: A | Discharge status: Home / Self Care | Payer: Worker's Compensation | Attending: Emergency Medicine | Admitting: Emergency Medicine

## 2018-06-02 ENCOUNTER — Other Ambulatory Visit: Payer: Self-pay

## 2018-06-02 DIAGNOSIS — T22212A Burn of second degree of left forearm, initial encounter: Secondary | ICD-10-CM | POA: Diagnosis not present

## 2018-06-02 DIAGNOSIS — Y93G3 Activity, cooking and baking: Secondary | ICD-10-CM | POA: Diagnosis not present

## 2018-06-02 DIAGNOSIS — Z79899 Other long term (current) drug therapy: Secondary | ICD-10-CM | POA: Insufficient documentation

## 2018-06-02 DIAGNOSIS — T22012A Burn of unspecified degree of left forearm, initial encounter: Secondary | ICD-10-CM | POA: Diagnosis present

## 2018-06-02 DIAGNOSIS — X102XXA Contact with fats and cooking oils, initial encounter: Secondary | ICD-10-CM | POA: Diagnosis not present

## 2018-06-02 DIAGNOSIS — Y929 Unspecified place or not applicable: Secondary | ICD-10-CM | POA: Diagnosis not present

## 2018-06-02 DIAGNOSIS — Y990 Civilian activity done for income or pay: Secondary | ICD-10-CM | POA: Diagnosis not present

## 2018-06-02 MED ORDER — SILVER SULFADIAZINE 1 % EX CREA
TOPICAL_CREAM | Freq: Once | CUTANEOUS | Status: AC
  Administered 2018-06-02: 1 via TOPICAL

## 2018-06-02 MED ORDER — SILVER SULFADIAZINE 1 % EX CREA
TOPICAL_CREAM | CUTANEOUS | Status: AC
  Filled 2018-06-02: qty 85

## 2018-06-02 MED ORDER — OXYCODONE-ACETAMINOPHEN 7.5-325 MG PO TABS: 1.0000 | ORAL_TABLET | Freq: Four times a day (QID) | ORAL | 0 refills | Status: DC | PRN

## 2018-06-02 MED ORDER — SILVER SULFADIAZINE 1 % EX CREA: TOPICAL_CREAM | CUTANEOUS | 1 refills | Status: DC

## 2018-06-02 MED ORDER — NAPROXEN 500 MG PO TABS: 500.0000 mg | ORAL_TABLET | Freq: Two times a day (BID) | ORAL | Status: DC

## 2018-06-02 MED ORDER — NAPROXEN 500 MG PO TABS
500.0000 mg | ORAL_TABLET | Freq: Once | ORAL | Status: AC
Start: 2018-06-02 — End: 2018-06-02
  Administered 2018-06-02: 500 mg via ORAL
  Filled 2018-06-02: qty 1
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
## 2018-06-02 NOTE — ED Notes (Signed)
Left arm swelling noted, pt states numbness to under side of arm.
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
## 2018-06-02 NOTE — ED Notes (Signed)
Silvadene/adaptic  dressing applied  Tolerated well
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
## 2018-06-02 NOTE — ED Notes (Signed)
Discussed with Dr Mayford Knife, states pt appropriate for flex.

## 2020-02-06 ENCOUNTER — Encounter: Payer: Self-pay | Admitting: Emergency Medicine

## 2020-02-06 ENCOUNTER — Emergency Department
Admission: EM | Admit: 2020-02-06 | Discharge: 2020-02-06 | Disposition: A | Discharge status: Home / Self Care | Payer: Self-pay | Attending: Student | Admitting: Student

## 2020-02-06 ENCOUNTER — Emergency Department: Payer: Self-pay

## 2020-02-06 ENCOUNTER — Other Ambulatory Visit: Payer: Self-pay

## 2020-02-06 DIAGNOSIS — M25561 Pain in right knee: Secondary | ICD-10-CM | POA: Insufficient documentation

## 2020-02-06 MED ORDER — KETOROLAC TROMETHAMINE 30 MG/ML IJ SOLN
30.0000 mg | Freq: Once | INTRAMUSCULAR | Status: AC
  Administered 2020-02-06: 30 mg via INTRAMUSCULAR
  Filled 2020-02-06: qty 1

## 2020-02-06 MED ORDER — KETOROLAC TROMETHAMINE 10 MG PO TABS: 10.0000 mg | ORAL_TABLET | Freq: Four times a day (QID) | ORAL | 0 refills | Status: AC | PRN
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
## 2021-11-30 IMAGING — DX DG KNEE 1-2V*R*
2 series · 2 of 2 positions shown · non-contrast
Comparison: None.

CLINICAL DATA: Right knee pain for 1 week after extended walking.
No known injury

EXAM:
RIGHT KNEE - 1-2 VIEW

[knee ap]
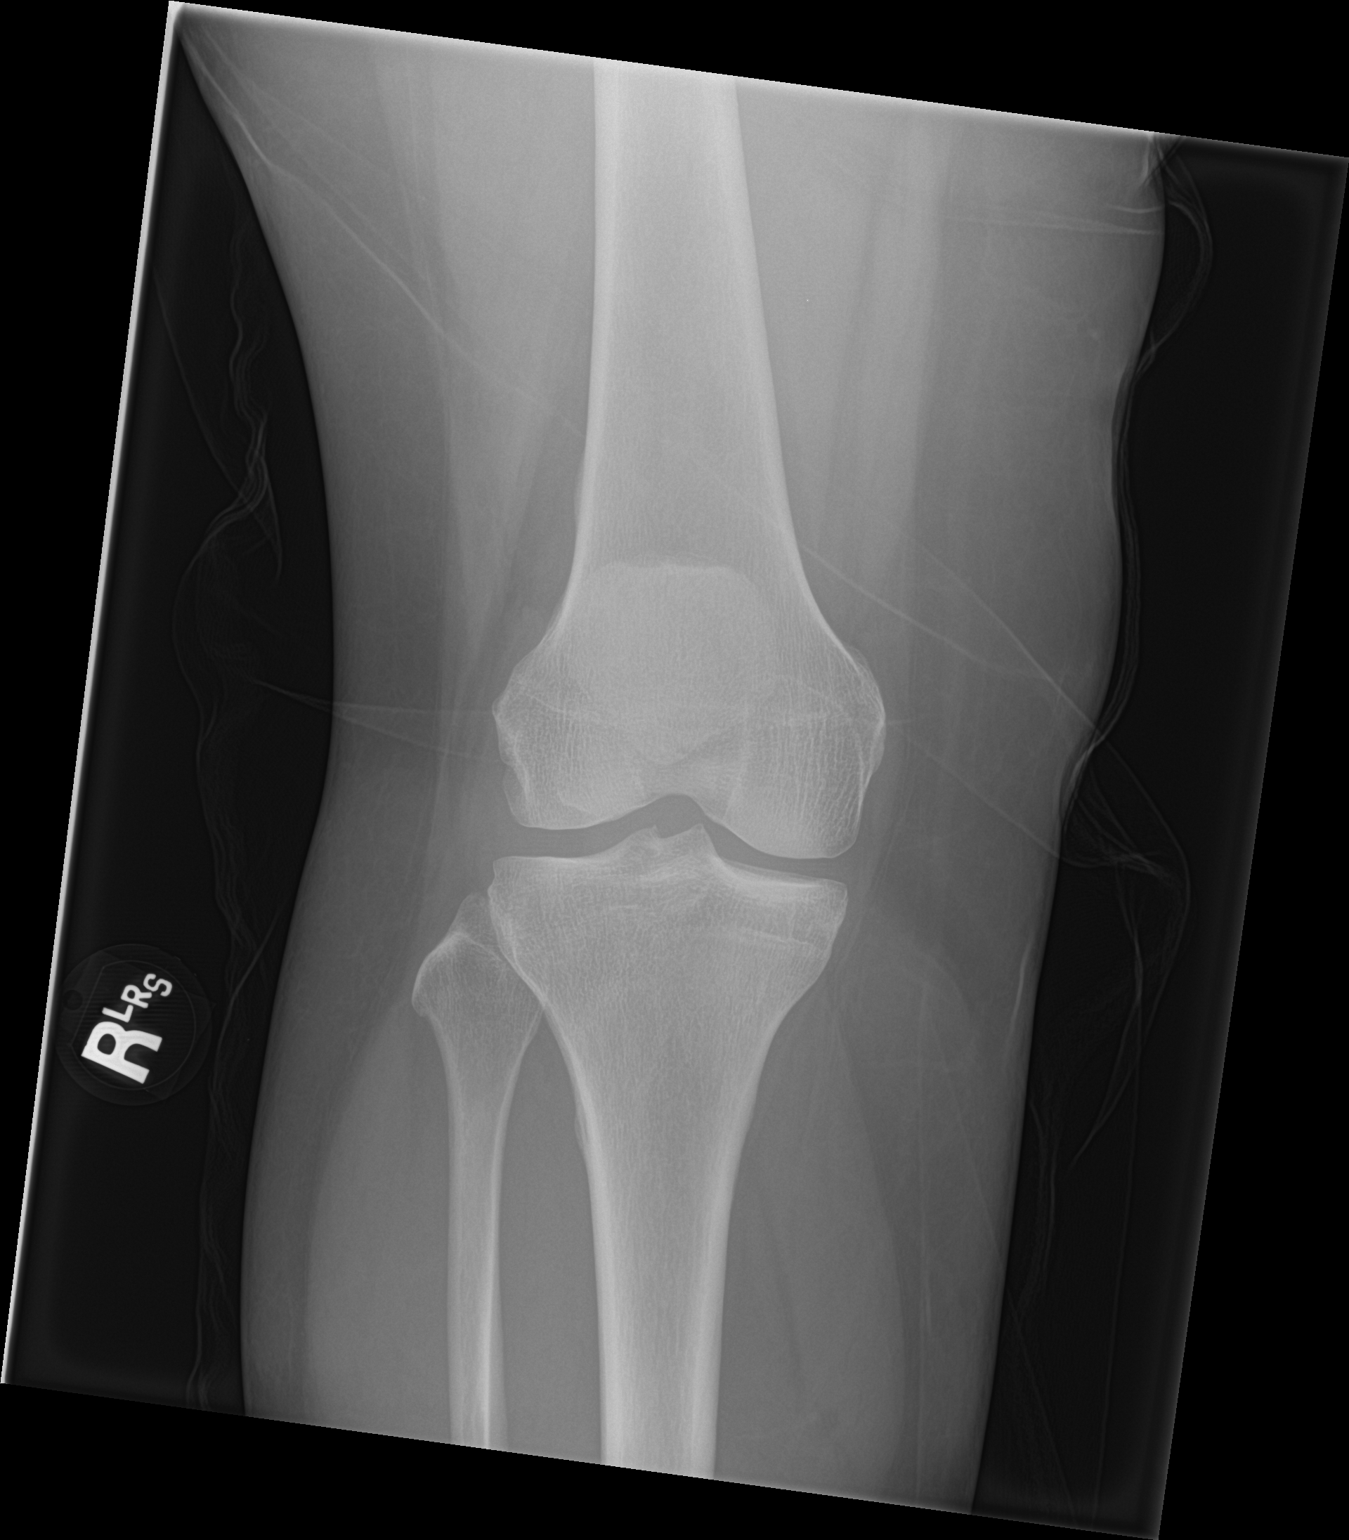

[knee lat]
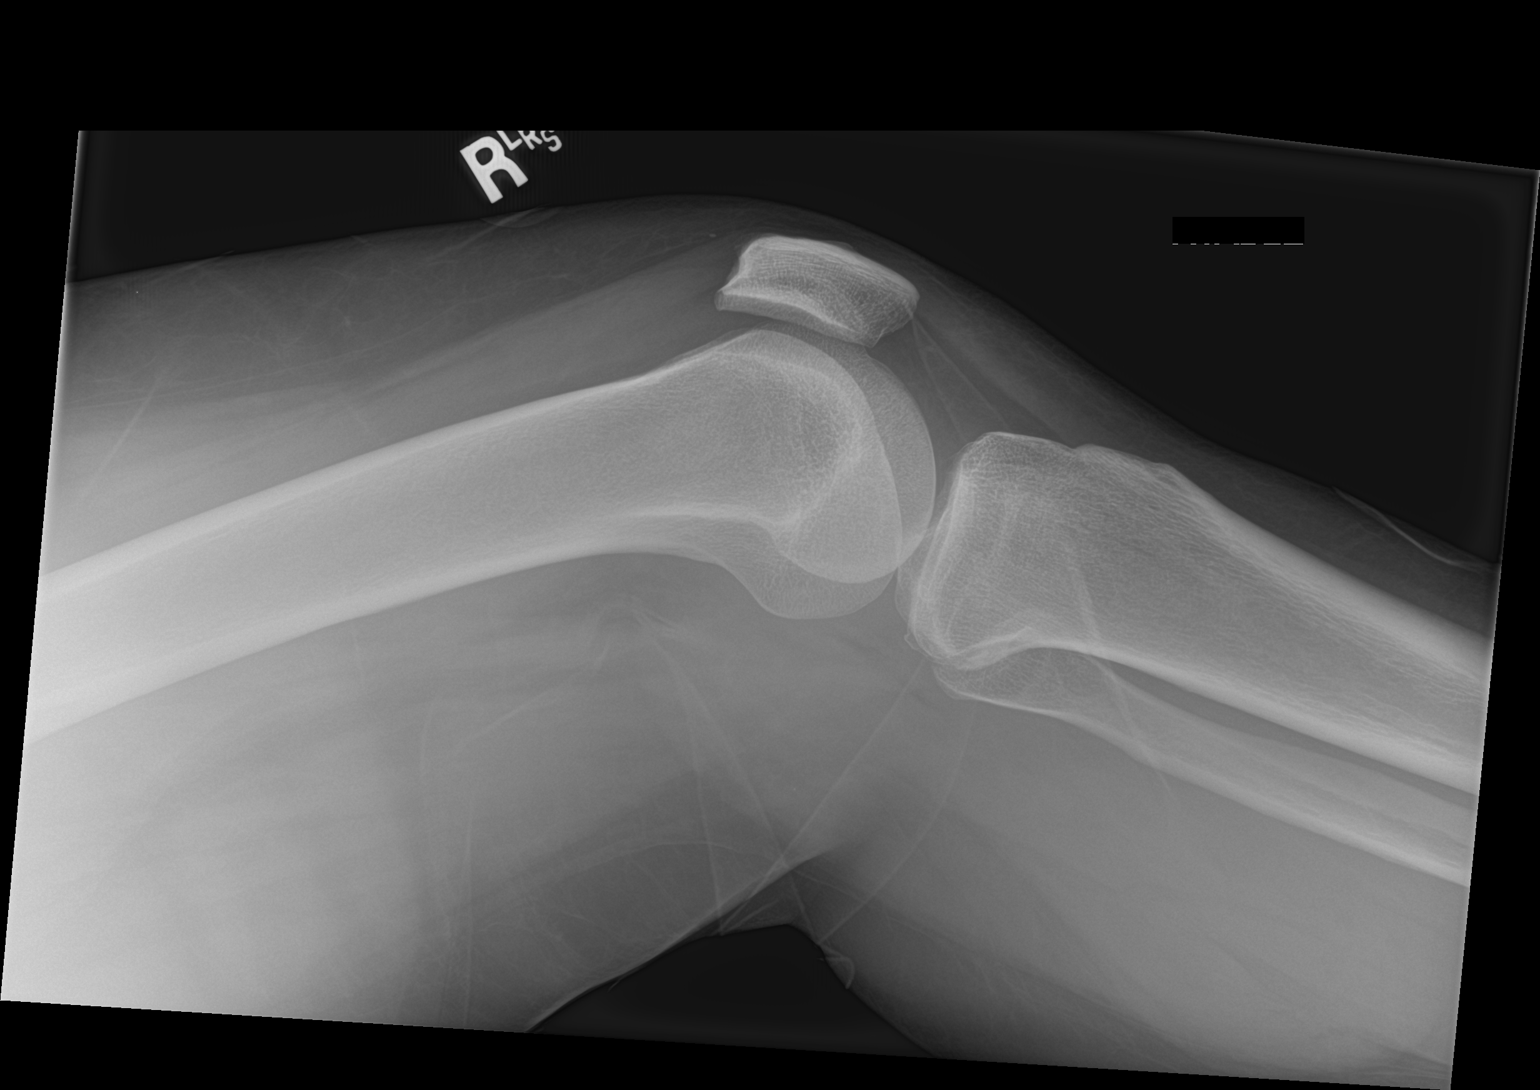

[2 of 2 positions shown; findings below may reference images not displayed]

FINDINGS: No evidence of fracture, dislocation, or joint effusion. No evidence
of arthropathy or other focal bone abnormality. Soft tissues are
unremarkable.
IMPRESSION: Normal exam.
